# Patient Record
Sex: Female | Born: 1980
Health system: Southern US, Community
[De-identification: ages and names within clinical notes are randomized; demographics above are authoritative.]

## PROBLEM LIST (undated history)

## (undated) DIAGNOSIS — J45909 Unspecified asthma, uncomplicated: Secondary | ICD-10-CM

## (undated) DIAGNOSIS — D649 Anemia, unspecified: Secondary | ICD-10-CM

## (undated) HISTORY — DX: Anemia, unspecified: D64.9

## (undated) HISTORY — PX: WISDOM TOOTH EXTRACTION: SHX21

---

## 2001-12-17 ENCOUNTER — Emergency Department (HOSPITAL_COMMUNITY): Admission: EM | Admit: 2001-12-17 | Discharge: 2001-12-17 | Payer: Self-pay | Admitting: Emergency Medicine

## 2001-12-17 ENCOUNTER — Encounter: Payer: Self-pay | Admitting: Emergency Medicine

## 2006-05-08 ENCOUNTER — Emergency Department (HOSPITAL_COMMUNITY): Admission: EM | Admit: 2006-05-08 | Discharge: 2006-05-08 | Payer: Self-pay | Admitting: Emergency Medicine

## 2006-05-15 ENCOUNTER — Emergency Department (HOSPITAL_COMMUNITY): Admission: EM | Admit: 2006-05-15 | Discharge: 2006-05-15 | Payer: Self-pay | Admitting: Emergency Medicine

## 2008-06-07 ENCOUNTER — Emergency Department (HOSPITAL_COMMUNITY): Admission: EM | Admit: 2008-06-07 | Discharge: 2008-06-07 | Payer: Self-pay | Admitting: Emergency Medicine

## 2008-12-22 ENCOUNTER — Emergency Department: Payer: Self-pay | Admitting: Emergency Medicine

## 2012-06-18 ENCOUNTER — Emergency Department: Payer: Self-pay | Admitting: Emergency Medicine

## 2012-06-23 ENCOUNTER — Emergency Department: Payer: Self-pay | Admitting: Emergency Medicine

## 2013-04-15 ENCOUNTER — Emergency Department (HOSPITAL_COMMUNITY)
Admission: EM | Admit: 2013-04-15 | Discharge: 2013-04-15 | Disposition: A | Discharge status: Home / Self Care | Payer: 59 | Attending: Emergency Medicine | Admitting: Emergency Medicine

## 2013-04-15 ENCOUNTER — Emergency Department (HOSPITAL_COMMUNITY): Payer: 59

## 2013-04-15 ENCOUNTER — Encounter (HOSPITAL_COMMUNITY): Payer: Self-pay | Admitting: Emergency Medicine

## 2013-04-15 DIAGNOSIS — J45909 Unspecified asthma, uncomplicated: Secondary | ICD-10-CM | POA: Insufficient documentation

## 2013-04-15 DIAGNOSIS — IMO0001 Reserved for inherently not codable concepts without codable children: Secondary | ICD-10-CM | POA: Insufficient documentation

## 2013-04-15 DIAGNOSIS — M542 Cervicalgia: Secondary | ICD-10-CM | POA: Insufficient documentation

## 2013-04-15 DIAGNOSIS — Z79899 Other long term (current) drug therapy: Secondary | ICD-10-CM | POA: Insufficient documentation

## 2013-04-15 HISTORY — DX: Unspecified asthma, uncomplicated: J45.909

## 2013-04-15 MED ORDER — DIAZEPAM 5 MG PO TABS: 5.0000 mg | ORAL_TABLET | Freq: Two times a day (BID) | ORAL | Status: DC

## 2013-04-15 MED ORDER — MELOXICAM 7.5 MG PO TABS: 15.0000 mg | ORAL_TABLET | Freq: Every day | ORAL | Status: DC

## 2013-04-15 MED ORDER — DIAZEPAM 5 MG PO TABS
5.0000 mg | ORAL_TABLET | Freq: Once | ORAL | Status: AC
  Administered 2013-04-15: 5 mg via ORAL
  Filled 2013-04-15: qty 1

## 2013-04-15 MED ORDER — KETOROLAC TROMETHAMINE 30 MG/ML IJ SOLN
30.0000 mg | Freq: Once | INTRAMUSCULAR | Status: AC
  Administered 2013-04-15: 30 mg via INTRAMUSCULAR
  Filled 2013-04-15: qty 1

## 2013-04-15 NOTE — ED Provider Notes (Signed)
CSN: [id number]     Arrival date & time 04/15/13  1234 History   First MD Initiated Contact with Patient 04/15/13 1253     Chief Complaint  Patient presents with  . Neck Pain   (Consider location/radiation/quality/duration/timing/severity/associated sxs/prior Treatment) HPI Comments: Patient is a 32 year old female with a history of asthma who presents for neck pain x "a few months", worsening x 1 week. Patient states that pain was intermittent onset but has become more constant in nature over the past week. She describes the pain as a tightening sensation that radiates from between her shoulder blades to the base of her neck. Patient denies any aggravating or alleviating factors of her symptoms. Patient has tried multiple over-the-counter medications without any relief so she stopped taking them. Patient does endorse being under more stress lately. She denies a history of cancer or IV drug use as well as associated fever, chest pain, shortness of breath, nausea or vomiting, numbness/tingling, extremity weakness, and injury to the area.  Patient is a 32 y.o. female presenting with neck pain. The history is provided by the patient. No language interpreter was used.  Neck Pain   Past Medical History  Diagnosis Date  . Asthma    History reviewed. No pertinent past surgical history. No family history on file. History  Substance Use Topics  . Smoking status: Never Smoker   . Smokeless tobacco: Not on file  . Alcohol Use: Yes   OB History   Grav Para Term Preterm Abortions TAB SAB Ect Mult Living                 Review of Systems  Musculoskeletal: Positive for myalgias and neck pain. Negative for neck stiffness.  All other systems reviewed and are negative.    Allergies  Review of patient's allergies indicates no known allergies.  Home Medications   Current Outpatient Rx  Name  Route  Sig  Dispense  Refill  . albuterol (PROVENTIL HFA;VENTOLIN HFA) 108 (90 BASE) MCG/ACT  inhaler   Inhalation   Inhale 2 puffs into the lungs every 6 (six) hours as needed for shortness of breath.         Marland Kitchen aspirin 325 MG tablet   Oral   Take 325 mg by mouth once as needed for pain.         Marland Kitchen HYDROcodone-acetaminophen (NORCO/VICODIN) 5-325 MG per tablet   Oral   Take 1 tablet by mouth every 6 (six) hours as needed for pain.         . Multiple Vitamin (MULTIVITAMIN WITH MINERALS) TABS tablet   Oral   Take 1 tablet by mouth daily.          BP 133/91  Pulse 84  Temp(Src) 98.3 F (36.8 C) (Oral)  Resp 16  Ht 5\' 9"  (1.753 m)  Wt 331 lb 1 oz (150.169 kg)  BMI 48.87 kg/m2  SpO2 100%  LMP 04/01/2013  Physical Exam  Nursing note and vitals reviewed. Constitutional: She is oriented to person, place, and time. She appears well-developed and well-nourished. No distress.  HENT:  Head: Normocephalic and atraumatic.  Eyes: Conjunctivae and EOM are normal. No scleral icterus.  Neck: Normal range of motion.  Cardiovascular: Normal rate, regular rhythm, normal heart sounds and intact distal pulses.   Pulses:      Radial pulses are 2+ on the right side, and 2+ on the left side.  Pulmonary/Chest: Effort normal and breath sounds normal. No respiratory distress. She has no wheezes.  She has no rales.  Musculoskeletal: Normal range of motion. She exhibits no edema.  TTP of b/l cervical and upper thoracic paraspinal muscles with spasm. No significant midline TTP. No bony deformities or step offs palpated.  Neurological: She is alert and oriented to person, place, and time.  GCS 15. Patient has equal grip strength bilaterally with 5/5 strength against resistance in her bilateral upper extremities. Sensation to light touch intact on bilateral upper extremities. Patient also able to differentiate between sharp and dull touch throughout dermatomes C3-T2.  Skin: Skin is warm and dry. No rash noted. She is not diaphoretic. No erythema. No pallor.  Psychiatric: She has a normal mood  and affect. Her behavior is normal.    ED Course  Procedures (including critical care time) Labs Review Labs Reviewed - No data to display Imaging Review Dg Cervical Spine With Flex & Extend  04/15/2013   CLINICAL DATA:  Chronic neck pain. No injury.  EXAM: CERVICAL SPINE COMPLETE WITH FLEXION AND EXTENSION VIEWS  COMPARISON:  None.  FINDINGS: Mild straightening in the cervical spine. Normal alignment at the cervicothoracic junction. Normal appearance of the prevertebral soft tissues. No pathologic motion on the flexion and extension views. Degenerative changes along the anterior aspect of C6-C7. The oblique images raise concern for irregularity involving the right C6 pedicle and facets. This could represent an old fracture. No significant bony encroachment of the neural foramen.  IMPRESSION: There is cortical irregularity and lucency in the right C6 pedicle and facet region. This finding could be related to an old fracture or developmental variant. This could be better evaluated with a CT of the cervical spine.  Degenerative changes at C6-C7.   Electronically Signed   By: Richarda Overlie M.D.   On: 04/15/2013 15:14    EKG Interpretation   None       MDM   1. Neck pain     32 year old female presents for neck pain which has been ongoing for the last few months and worsening x 1 week. Patient denies a history of neck trauma or injury as well as cancer or IV drug use. She is well and nontoxic appearing today as well as hemodynamically stable and afebrile. Patient with normal range of motion of her bilateral upper extremities with normal grip strength, strength against resistance, and sensation. X-ray of the cervical spine ordered for further evaluation of patient's symptoms. Findings with cortical irregularity and lucency of the right C6 pedicle. Radiology recommending further evaluation with CT scan. CT ordered for further evaluation of the x-ray findings. Patient treated in the ED with Toradol and  Valium. Patient endorses moderate to significant improvement in her symptoms with this treatment regimen.   Patient signed out to Irish Elders, FNP at shift change with imaging pending. Anticipate d/c home with orthopedic follow up, Mobic, and Valium for symptoms if CT imaging unremarkable.    Antony Madura, PA-C 04/15/13 1539

## 2013-04-15 NOTE — ED Provider Notes (Signed)
Medical screening examination/treatment/procedure(s) were performed by non-physician practitioner and as supervising physician I was immediately available for consultation/collaboration.  EKG Interpretation   None         Gorgeous Newlun N Fabiana Dromgoole, DO 04/15/13 1623 

## 2013-04-15 NOTE — ED Provider Notes (Signed)
Patient seen/examined in the Emergency Department in conjunction with Midlevel Provider Walker Patient reports neck pain Exam : awake/alert, no distress, watching TV, no focal weakness noted Plan: awaiting CT imaging    Joya Gaskins, MD 04/15/13 [phone]

## 2013-04-15 NOTE — ED Notes (Signed)
Pt back to room from CT

## 2013-04-15 NOTE — ED Notes (Addendum)
Back of neck pain and between shoulder blades hurting x a few months since sat had hurt non stop has been taking otc meds not helping demies injury or sleeping wrong back of head tingling dizziness started the past few days

## 2013-04-15 NOTE — ED Provider Notes (Signed)
  Physical Exam  BP 133/106  Pulse 68  Temp(Src) 98.3 F (36.8 C) (Oral)  Resp 16  Ht 5\' 9"  (1.753 m)  Wt 331 lb 1 oz (150.169 kg)  BMI 48.87 kg/m2  SpO2 100%  LMP 04/01/2013  Physical Exam  ED Course  Procedures  MDM Neck pain   Feeling better after meds, C6-C7 degenerative changes vs old fx. Evaluate with C-spine CT. Awaiting results. Anticipate discharge and follow-up with Ortho. C-spine; degenerative changes in lower Cspine. May need MRI, Pt agrees to follow-up with Ortho.      Irish Elders, NP 04/15/13 1649

## 2013-04-15 NOTE — ED Notes (Addendum)
Onset several months, upper back pain between shoulder blades.  Pain became constant 6 days ago and pain is moving up into back of head.  No known injuries or exercise.  Went to chiropractor x 1 with temporary relief.  Last took hydrocodone last night no relief in pain, did get some sleep.  Gets best relief laying flat.  Onset 1 week tingling noted in right arm at times.   Arm grips equal.  No numbness or tingling in LE.

## 2013-04-16 NOTE — ED Provider Notes (Signed)
Medical screening examination/treatment/procedure(s) were conducted as a shared visit with non-physician practitioner(s) and myself.  I personally evaluated the patient during the encounter.  EKG Interpretation   None         Joya Gaskins, MD 04/16/13 1045

## 2020-04-17 DIAGNOSIS — E611 Iron deficiency: Secondary | ICD-10-CM | POA: Insufficient documentation

## 2021-02-28 ENCOUNTER — Encounter: Payer: Self-pay | Admitting: Obstetrics & Gynecology

## 2021-02-28 ENCOUNTER — Telehealth: Payer: Self-pay | Admitting: Obstetrics & Gynecology

## 2021-03-21 ENCOUNTER — Encounter: Payer: Self-pay | Admitting: Obstetrics & Gynecology

## 2021-04-18 ENCOUNTER — Other Ambulatory Visit: Payer: Self-pay

## 2021-04-18 ENCOUNTER — Encounter: Payer: Self-pay | Admitting: Obstetrics & Gynecology

## 2021-04-18 ENCOUNTER — Other Ambulatory Visit (HOSPITAL_COMMUNITY)
Admission: RE | Admit: 2021-04-18 | Discharge: 2021-04-18 | Disposition: A | Discharge status: Home / Self Care | Payer: 59 | Source: Ambulatory Visit | Attending: Obstetrics & Gynecology | Admitting: Obstetrics & Gynecology

## 2021-04-18 ENCOUNTER — Ambulatory Visit (INDEPENDENT_AMBULATORY_CARE_PROVIDER_SITE_OTHER): Payer: 59 | Admitting: Obstetrics & Gynecology

## 2021-04-18 ENCOUNTER — Encounter: Payer: 59 | Admitting: Obstetrics & Gynecology

## 2021-04-18 VITALS — BP 170/107 | HR 89 | Ht 70.0 in | Wt 355.2 lb

## 2021-04-18 DIAGNOSIS — Z6841 Body Mass Index (BMI) 40.0 and over, adult: Secondary | ICD-10-CM | POA: Diagnosis not present

## 2021-04-18 DIAGNOSIS — Z124 Encounter for screening for malignant neoplasm of cervix: Secondary | ICD-10-CM | POA: Diagnosis not present

## 2021-04-18 DIAGNOSIS — E669 Obesity, unspecified: Secondary | ICD-10-CM | POA: Insufficient documentation

## 2021-04-18 DIAGNOSIS — N923 Ovulation bleeding: Secondary | ICD-10-CM

## 2021-04-18 MED ORDER — MEDROXYPROGESTERONE ACETATE 10 MG PO TABS: ORAL_TABLET | ORAL | 2 refills | Status: AC

## 2021-04-18 NOTE — Progress Notes (Signed)
  Subjective:     Patient ID: Erin Zuniga, female   DOB: [birth year], 40 y.o.   MRN: [id number]  Erin Zuniga is a 40 year old woman who presents with a three month history of daily intermenstrual spotting lasting the entire month between menstrual cycles. She describes the spotting as light, pink, with a mild fishy odor. Her LMP was one week ago and she normally has bleeding for a week with her regular cycles. When her symptoms first began, she thought the bleeding may be related to an episode of intercourse; however, she feels that should not cause this level of prolonged daily spotting. She does not endorse any previous history of vagina spotting for this duration. She has had BV in the past and states the current odor seems similar to her previous BV episodes. She has a history of genital herpes for which she takes L-Lysine 1000 mg daily.  Vaginal Bleeding The patient's primary symptoms include vaginal discharge. The patient's pertinent negatives include no pelvic pain. Pertinent negatives include no chills, fever, nausea or vomiting.    Review of Systems  Constitutional:  Negative for chills and fever.  Respiratory:  Negative for cough and shortness of breath.   Gastrointestinal:  Negative for nausea and vomiting.  Genitourinary:  Positive for vaginal bleeding and vaginal discharge. Negative for pelvic pain and vaginal pain.      Objective:   Physical Exam Constitutional:      General: She is not in acute distress. Genitourinary:    Labia:        Right: No rash, tenderness, lesion or injury.        Left: No rash, tenderness, lesion or injury.      Vagina: Vaginal discharge (white, non malodorous) present. No erythema, bleeding or lesions.     Cervix: No friability, erythema or cervical bleeding.  Skin:    General: Skin is warm and dry.  Neurological:     Mental Status: She is alert.  Psychiatric:        Behavior: Behavior is cooperative.       Assessment:     Erin Zuniga is a 40 year old woman with a three month history of intermenstrual vaginal spotting and vaginal discharge concerning for possible vaginitis vs. irregular menstrual bleeding.   Vaginal Spotting Daily intermenstrual vaginal spotting for three months duration. No current hormonal contraceptive use. No bleeding noted on exam today. Would like to see this vaginal spotting resolve with progesterone use during first two weeks of each month. Suspect this will also provide her with lighter menstrual periods.  Vaginal Discharge Pt with vaginal discharge following episode of intercourse which is similar to her prior episodes of BV. White vaginal discharge present on pelvic exam today suggesting possible vaginitis.    Plan:     Vaginal Spotting -Provera 10 mg daily for day 1-14 of each month -Follow up after at least 2 months for symptom re-check  Vaginal Discharge -Wet mount and pap testing performed today in office -Will follow up on results and initiate appropriate treatment if indicated by results -Plan to inform patients of results  Attestation of Attending Supervision of Medical Student: Evaluation and management procedures were performed by the medical student under my supervision and collaboration.  I have reviewed the student's note and chart, and I agree with the management and plan.  Scheryl Darter, MD, FACOG Attending Obstetrician & Gynecologist Faculty Practice, [facility]

## 2021-04-20 LAB — CYTOLOGY - PAP
Chlamydia: NEGATIVE
Comment: NEGATIVE
Comment: NEGATIVE
Comment: NEGATIVE
Comment: NORMAL
Diagnosis: NEGATIVE
High risk HPV: NEGATIVE
Neisseria Gonorrhea: NEGATIVE
Trichomonas: NEGATIVE

## 2021-05-10 ENCOUNTER — Other Ambulatory Visit: Payer: Self-pay | Admitting: Nurse Practitioner

## 2021-05-10 DIAGNOSIS — N939 Abnormal uterine and vaginal bleeding, unspecified: Secondary | ICD-10-CM

## 2021-05-10 DIAGNOSIS — N923 Ovulation bleeding: Secondary | ICD-10-CM

## 2021-05-25 ENCOUNTER — Ambulatory Visit
Admission: RE | Admit: 2021-05-25 | Discharge: 2021-05-25 | Disposition: A | Discharge status: Home / Self Care | Payer: 59 | Source: Ambulatory Visit | Attending: Nurse Practitioner | Admitting: Nurse Practitioner

## 2021-05-25 DIAGNOSIS — N923 Ovulation bleeding: Secondary | ICD-10-CM

## 2021-05-25 DIAGNOSIS — N939 Abnormal uterine and vaginal bleeding, unspecified: Secondary | ICD-10-CM

## 2021-05-30 ENCOUNTER — Other Ambulatory Visit: Payer: Self-pay | Admitting: Nurse Practitioner

## 2021-05-30 DIAGNOSIS — N9489 Other specified conditions associated with female genital organs and menstrual cycle: Secondary | ICD-10-CM

## 2022-06-16 IMAGING — US US PELVIS COMPLETE WITH TRANSVAGINAL
1 series · 13 of 25 positions shown · non-contrast
Comparison: None

CLINICAL DATA: Abnormal uterine bleeding, intermenstrual bleeding,
continuous bleeding, LMP 04/09/2021

EXAM:
TRANSABDOMINAL AND TRANSVAGINAL ULTRASOUND OF PELVIS
TECHNIQUE: Both transabdominal and transvaginal ultrasound examinations of the
pelvis were performed. Transabdominal technique was performed for
global imaging of the pelvis including uterus, ovaries, adnexal
regions, and pelvic cul-de-sac. It was necessary to proceed with
endovaginal exam following the transabdominal exam to visualize the
uterus, endometrium, and ovaries. Transabdominal imaging limited by
inadequate bladder distention and poor acoustic window.

[Series 1: us pelvis complete with transvaginal · 0.28mm/px · 56 acquisitions, 13 frames shown]
[im 1/56]
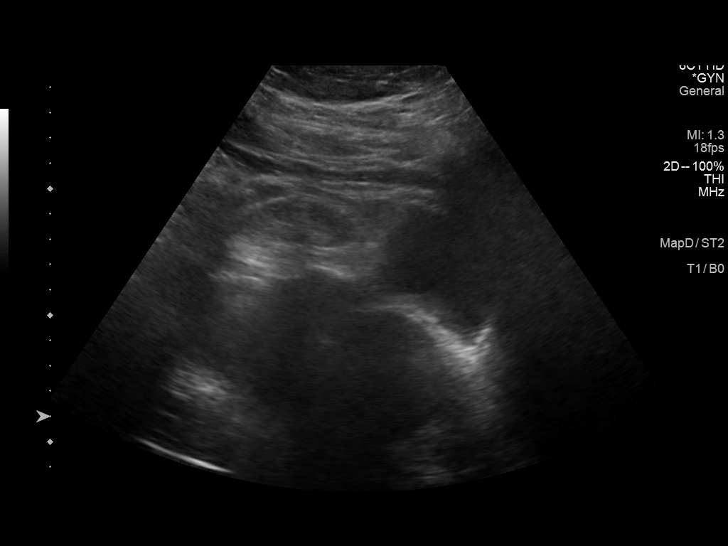
[im 5/56]
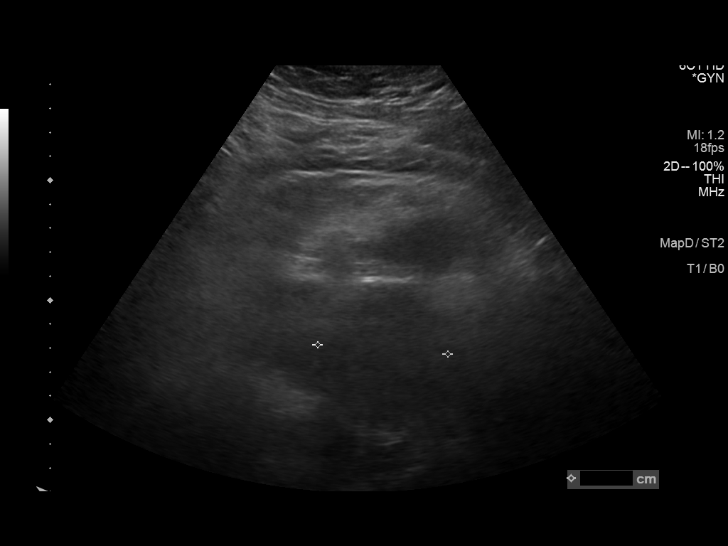
[im 10/56]
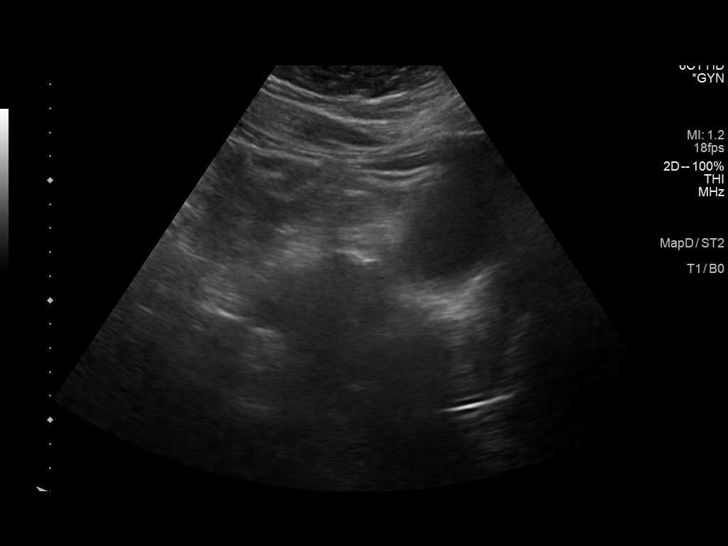
[im 14/56]
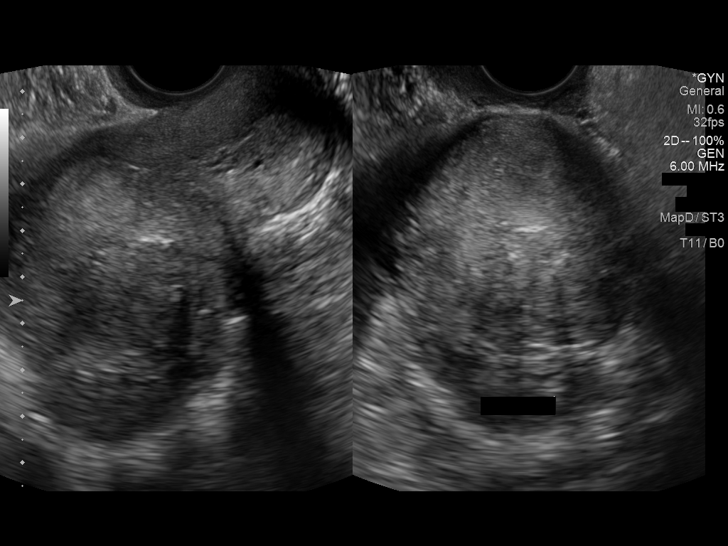
[im 19/56]
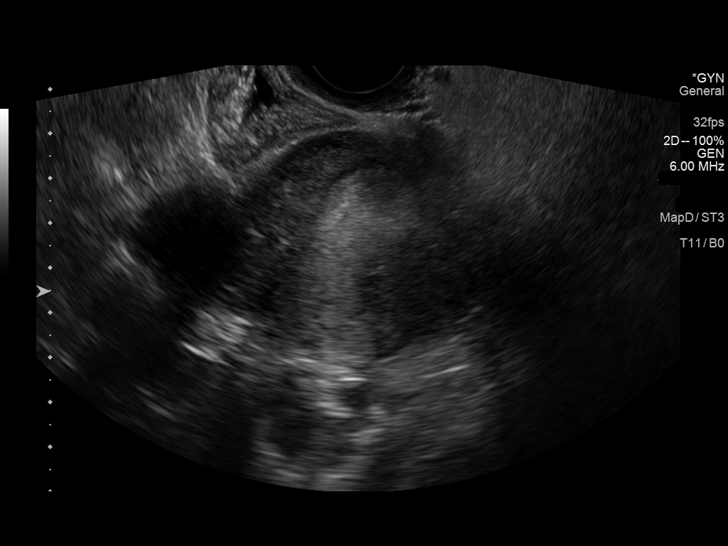
[im 23/56]
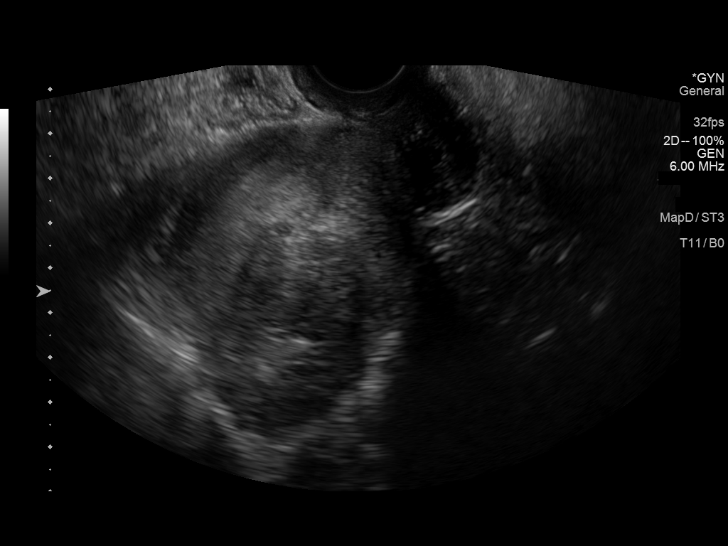
[im 28/56]
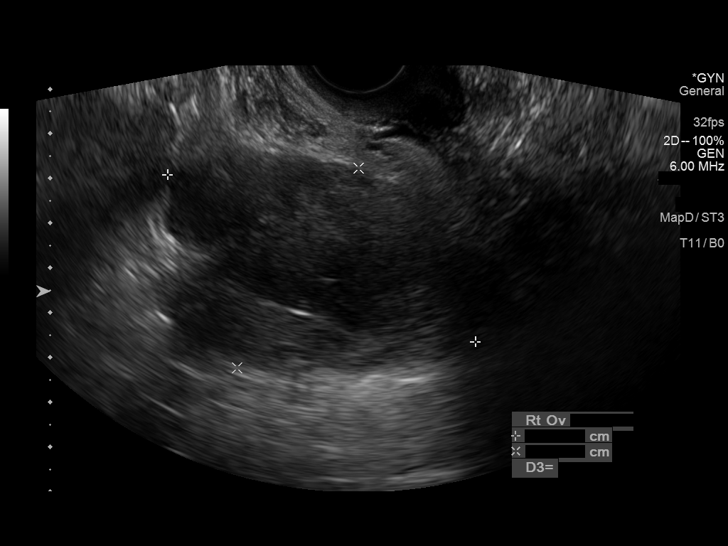
[im 33/56]
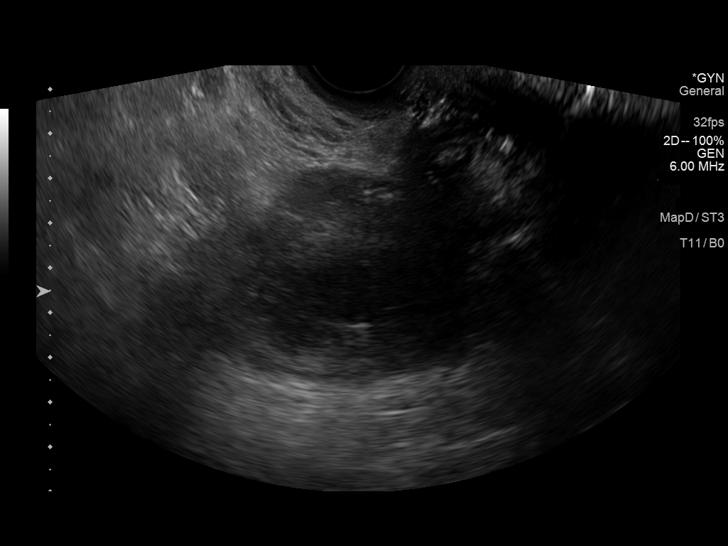
[im 37/56]
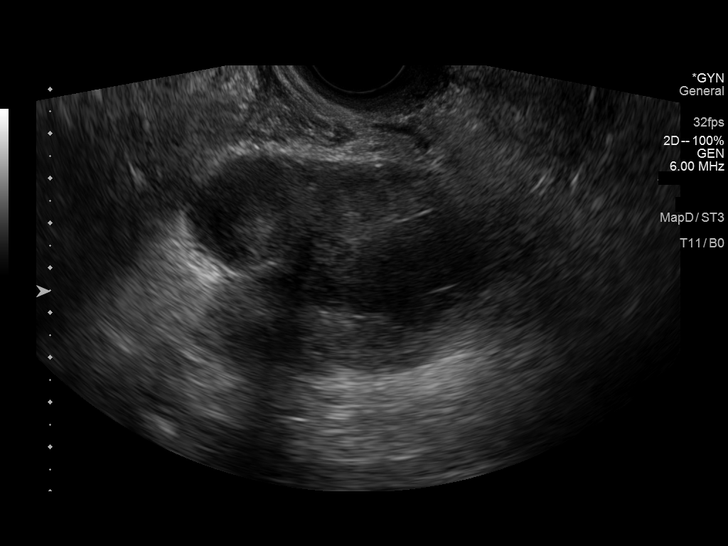
[im 42/56]
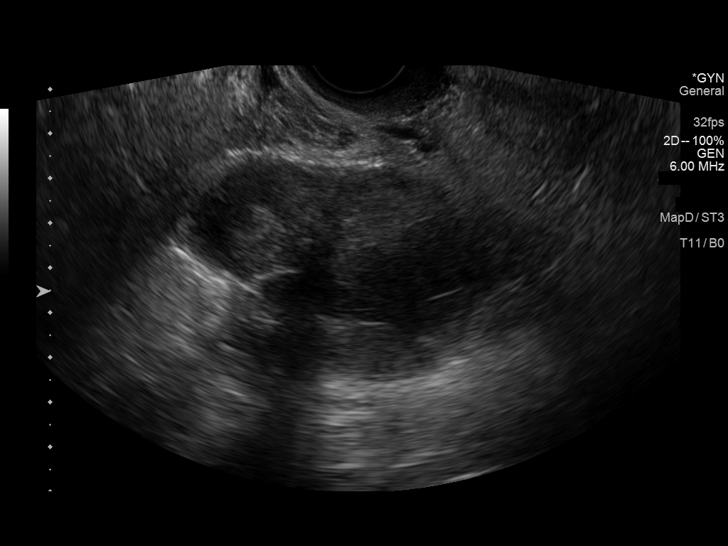
[im 46/56]
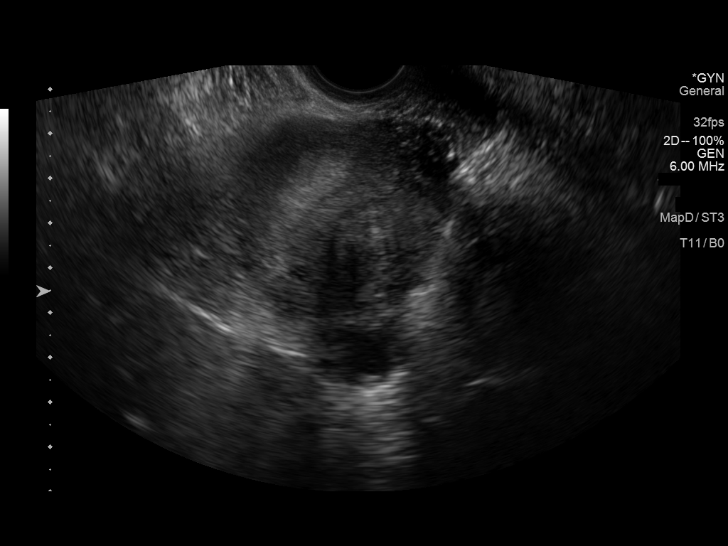
[im 51/56]
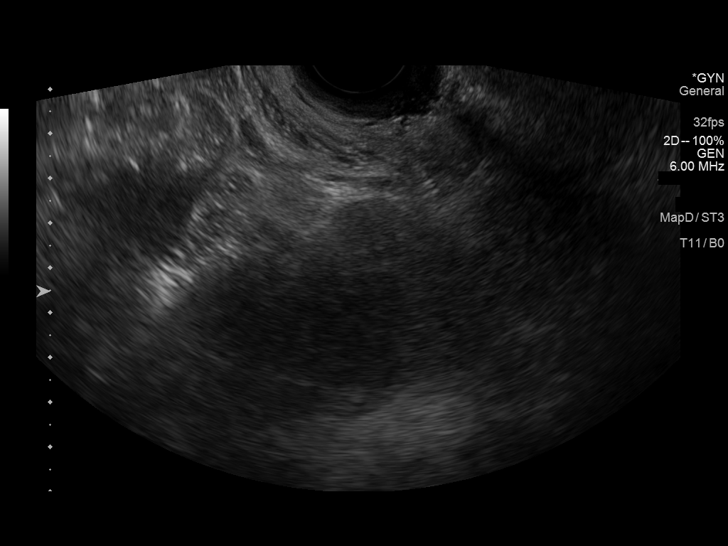
[im 56/56]
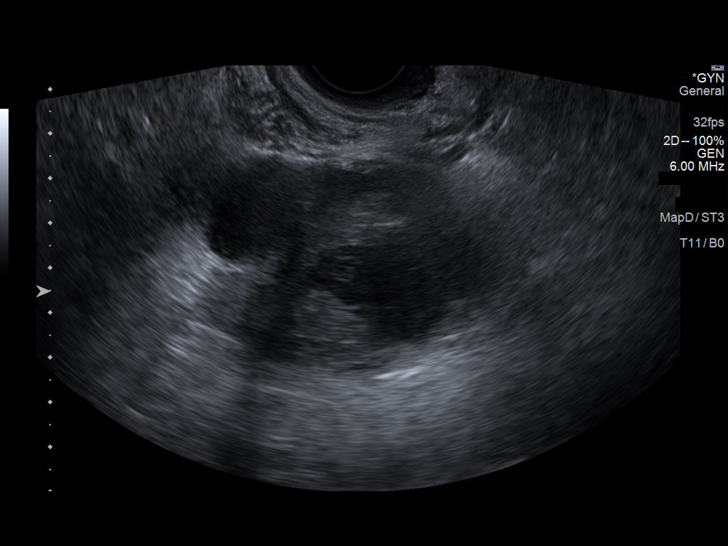

[13 of 25 positions shown; findings below may reference images not displayed]

FINDINGS: Uterus

Measurements: 8.8 x 4.3 x 5.1 cm = volume: 102 mL. Anteverted. Large
mass at upper uterus consistent with a large uterine leiomyoma
measuring 5.1 x 5.1 x 5.0 cm, likely posterior.

Endometrium

Obscured by presence of a large leiomyoma at the upper uterus

Right ovary

No normal appearing RIGHT ovary visualized, see below

Left ovary

Measurements: 2.7 x 1.5 x 2.4 cm = volume: 4.8 mL. Normal morphology
without mass

Other findings

No free pelvic fluid. RIGHT adnexal mass 7.8 x 5.2 x 4.4 cm, 95 mL
volume, may represent enlarged abnormal RIGHT ovary or less likely
portions of this may represent exophytic leiomyoma arising from the
uterus.
IMPRESSION: Large upper uterine leiomyoma 5.1 cm diameter.

Obscured endometrial complex.

7.8 x 5.2 x 4.4 cm indeterminate RIGHT adnexal mass question ovarian
mass or potentially exophytic leiomyoma arising from uterus; further
characterization by MR imaging with and without contrast
recommended.

## 2022-10-09 ENCOUNTER — Encounter (HOSPITAL_COMMUNITY): Payer: Self-pay

## 2022-10-09 ENCOUNTER — Non-Acute Institutional Stay (HOSPITAL_COMMUNITY)
Admission: RE | Admit: 2022-10-09 | Discharge: 2022-10-09 | Disposition: A | Discharge status: Home / Self Care | Payer: 59 | Source: Ambulatory Visit | Attending: Obstetrics and Gynecology | Admitting: Obstetrics and Gynecology

## 2022-10-09 ENCOUNTER — Ambulatory Visit (INDEPENDENT_AMBULATORY_CARE_PROVIDER_SITE_OTHER): Payer: 59 | Admitting: Nurse Practitioner

## 2022-10-09 ENCOUNTER — Encounter: Payer: Self-pay | Admitting: Nurse Practitioner

## 2022-10-09 ENCOUNTER — Telehealth (HOSPITAL_COMMUNITY): Payer: Self-pay

## 2022-10-09 VITALS — BP 142/75 | Temp 97.7°F | Ht 70.0 in | Wt 354.2 lb

## 2022-10-09 DIAGNOSIS — Z539 Procedure and treatment not carried out, unspecified reason: Secondary | ICD-10-CM | POA: Insufficient documentation

## 2022-10-09 DIAGNOSIS — R030 Elevated blood-pressure reading, without diagnosis of hypertension: Secondary | ICD-10-CM

## 2022-10-09 DIAGNOSIS — D649 Anemia, unspecified: Secondary | ICD-10-CM | POA: Diagnosis not present

## 2022-10-09 MED ORDER — SODIUM CHLORIDE 0.9 % IV SOLN
510.0000 mg | Freq: Once | INTRAVENOUS | Status: DC
  Filled 2022-10-09: qty 17

## 2022-10-09 MED ORDER — SODIUM CHLORIDE 0.9 % IV SOLN: INTRAVENOUS | Status: DC | PRN

## 2022-10-09 NOTE — Progress Notes (Signed)
  ID: Erin Zuniga, female    DOB: [birth year], 42 y.o.   MRN: [id number]  Chief Complaint  Patient presents with   Establish Care   Hypertension    Referring provider: No ref. provider found  HPI  42 year old female with history of obesity, low iron, irregular menstrual cycles.  Patient is followed by [facility] and was sent to our office for an iron infusion in the day hospital today.  Her blood pressure was noted to be elevated on arrival since she was placed on her schedule for checkup for hypertension.  After sitting in the waiting room for a little while pressure did return to normal.  Patient does not currently have a PCP.  We discussed that we will follow-up in 1 month to recheck blood pressure and do a complete physical. Denies f/c/s, n/v/d, hemoptysis, PND, leg swelling Denies chest pain or edema        No Known Allergies   There is no immunization history on file for this patient.  Past Medical History:  Diagnosis Date   Anemia    Asthma     Tobacco History: Social History   Tobacco Use  Smoking Status Never  Smokeless Tobacco Not on file   Counseling given: Not Answered   Outpatient Encounter Medications as of 10/09/2022  Medication Sig   albuterol (PROVENTIL HFA;VENTOLIN HFA) 108 (90 BASE) MCG/ACT inhaler Inhale 2 puffs into the lungs every 6 (six) hours as needed for shortness of breath.   aspirin 325 MG tablet Take 325 mg by mouth once as needed for pain.   ferrous sulfate 325 (65 FE) MG tablet Take 325 mg by mouth daily with breakfast.   medroxyPROGESTERone (PROVERA) 10 MG tablet Daily for day 1-14 each month   Multiple Vitamin (MULTIVITAMIN WITH MINERALS) TABS tablet Take 1 tablet by mouth daily.   OVER THE COUNTER MEDICATION L-lysine PRN   Facility-Administered Encounter Medications as of 10/09/2022  Medication   0.9 %  sodium chloride infusion   ferumoxytol (FERAHEME) 510 mg in sodium chloride 0.9 % 100 mL IVPB     Review  of Systems  Review of Systems  Constitutional: Negative.   HENT: Negative.    Cardiovascular: Negative.   Gastrointestinal: Negative.   Allergic/Immunologic: Negative.   Neurological: Negative.   Psychiatric/Behavioral: Negative.         Physical Exam  BP (!) 142/75   Temp 97.7 F (36.5 C)   Ht  (1.778 m)   Wt (!) 354 lb 3.2 oz (160.7 kg)   LMP 09/30/2022   BMI 50.82 kg/m   Wt Readings from Last 5 Encounters:  10/09/22 (!) 354 lb 3.2 oz (160.7 kg)  04/18/21 (!) 355 lb 3.2 oz (161.1 kg)  04/15/13 (!) 331 lb 1 oz (150.2 kg)     Physical Exam Vitals and nursing note reviewed.  Constitutional:      General: She is not in acute distress.    Appearance: She is well-developed.  Cardiovascular:     Rate and Rhythm: Normal rate and regular rhythm.  Pulmonary:     Effort: Pulmonary effort is normal.     Breath sounds: Normal breath sounds.  Neurological:     Mental Status: She is alert and oriented to person, place, and time.      Lab Results:  CBC No results found for: "WBC", "RBC", "HGB", "HCT", "PLT", "MCV", "MCH", "MCHC", "RDW", "LYMPHSABS", "MONOABS", "EOSABS", "BASOSABS"  BMET No results found for: "NA", "K", "CL", "CO2", "GLUCOSE", "  BUN", "CREATININE", "CALCIUM", "GFRNONAA", "GFRAA"   Assessment & Plan:   Elevated blood pressure reading Blood pressure stable in the office today  Low salt diet  Start exercise routine   Follow up:  Follow up in 3 months     Ivonne Andrew, NP 10/09/2022

## 2022-10-09 NOTE — Progress Notes (Signed)
Pt came into clinic today for fist dose of IV Feraheme 510 mg. Pt BP was 163/109. BP on recheck was 159/113. Pt reports having no symptoms, and states that she is nervous. Pt states that she does not take any medication for BP. RN called [facility], and spoke with Rogers. Per Dr. Mindi Slicker, IV Feraheme should not be administered today due to elevated BP, and that pt should see PCP to address BP and reschedule to receive iron infusion. Per pt she currently does not have PCP. [facility], and will see Angus Seller, FNP at 11:20 AM today to establish care. Pt advised to reschedule iron infusion after BP issues have been addressed by PCP, patient verbalized understanding. Pt is alert, oriented, and ambulatory at discharge.

## 2022-10-09 NOTE — Patient Instructions (Addendum)
1. Elevated blood pressure reading   Blood pressure stable in the office today  Low salt diet  Start exercise routine   Follow up:  Follow up in 3 months    DASH Eating Plan DASH stands for Dietary Approaches to Stop Hypertension. The DASH eating plan is a healthy eating plan that has been shown to: Reduce high blood pressure (hypertension). Reduce your risk for type 2 diabetes, heart disease, and stroke. Help with weight loss. What are tips for following this plan? Reading food labels Check food labels for the amount of salt (sodium) per serving. Choose foods with less than 5 percent of the Daily Value of sodium. Generally, foods with less than 300 milligrams (mg) of sodium per serving fit into this eating plan. To find whole grains, look for the word "whole" as the first word in the ingredient list. Shopping Buy products labeled as "low-sodium" or "no salt added." Buy fresh foods. Avoid canned foods and pre-made or frozen meals. Cooking Avoid adding salt when cooking. Use salt-free seasonings or herbs instead of table salt or sea salt. Check with your health care provider or pharmacist before using salt substitutes. Do not fry foods. Cook foods using healthy methods such as baking, boiling, grilling, roasting, and broiling instead. Cook with heart-healthy oils, such as olive, canola, avocado, soybean, or sunflower oil. Meal planning  Eat a balanced diet that includes: 4 or more servings of fruits and 4 or more servings of vegetables each day. Try to fill one-half of your plate with fruits and vegetables. 6-8 servings of whole grains each day. Less than 6 oz (170 g) of lean meat, poultry, or fish each day. A 3-oz (85-g) serving of meat is about the same size as a deck of cards. One egg equals 1 oz (28 g). 2-3 servings of low-fat dairy each day. One serving is 1 cup (237 mL). 1 serving of nuts, seeds, or beans 5 times each week. 2-3 servings of heart-healthy fats. Healthy fats  called omega-3 fatty acids are found in foods such as walnuts, flaxseeds, fortified milks, and eggs. These fats are also found in cold-water fish, such as sardines, salmon, and mackerel. Limit how much you eat of: Canned or prepackaged foods. Food that is high in trans fat, such as some fried foods. Food that is high in saturated fat, such as fatty meat. Desserts and other sweets, sugary drinks, and other foods with added sugar. Full-fat dairy products. Do not salt foods before eating. Do not eat more than 4 egg yolks a week. Try to eat at least 2 vegetarian meals a week. Eat more home-cooked food and less restaurant, buffet, and fast food. Lifestyle When eating at a restaurant, ask that your food be prepared with less salt or no salt, if possible. If you drink alcohol: Limit how much you use to: 0-1 drink a day for women who are not pregnant. 0-2 drinks a day for men. Be aware of how much alcohol is in your drink. In the [place] one drink equals one 12 oz bottle of beer (355 mL), one 5 oz glass of wine (148 mL), or one 1 oz glass of hard liquor (44 mL). General information Avoid eating more than 2,300 mg of salt a day. If you have hypertension, you may need to reduce your sodium intake to 1,500 mg a day. Work with your health care provider to maintain a healthy body weight or to lose weight. Ask what an ideal weight is for you. Get at  least 30 minutes of exercise that causes your heart to beat faster (aerobic exercise) most days of the week. Activities may include walking, swimming, or biking. Work with your health care provider or dietitian to adjust your eating plan to your individual calorie needs. What foods should I eat? Fruits All fresh, dried, or frozen fruit. Canned fruit in natural juice (without added sugar). Vegetables Fresh or frozen vegetables (raw, steamed, roasted, or grilled). Low-sodium or reduced-sodium tomato and vegetable juice. Low-sodium or reduced-sodium tomato sauce  and tomato paste. Low-sodium or reduced-sodium canned vegetables. Grains Whole-grain or whole-wheat bread. Whole-grain or whole-wheat pasta. Brown rice. Orpah Cobb. Bulgur. Whole-grain and low-sodium cereals. Pita bread. Low-fat, low-sodium crackers. Whole-wheat flour tortillas. Meats and other proteins Skinless chicken or [place]. Ground chicken or [place]. Pork with fat trimmed off. Fish and seafood. Egg whites. Dried beans, peas, or lentils. Unsalted nuts, nut butters, and seeds. Unsalted canned beans. Lean cuts of beef with fat trimmed off. Low-sodium, lean precooked or cured meat, such as sausages or meat loaves. Dairy Low-fat (1%) or fat-free (skim) milk. Reduced-fat, low-fat, or fat-free cheeses. Nonfat, low-sodium ricotta or cottage cheese. Low-fat or nonfat yogurt. Low-fat, low-sodium cheese. Fats and oils Soft margarine without trans fats. Vegetable oil. Reduced-fat, low-fat, or light mayonnaise and salad dressings (reduced-sodium). Canola, safflower, olive, avocado, soybean, and sunflower oils. Avocado. Seasonings and condiments Herbs. Spices. Seasoning mixes without salt. Other foods Unsalted popcorn and pretzels. Fat-free sweets. The items listed above may not be a complete list of foods and beverages you can eat. Contact a dietitian for more information. What foods should I avoid? Fruits Canned fruit in a light or heavy syrup. Fried fruit. Fruit in cream or butter sauce. Vegetables Creamed or fried vegetables. Vegetables in a cheese sauce. Regular canned vegetables (not low-sodium or reduced-sodium). Regular canned tomato sauce and paste (not low-sodium or reduced-sodium). Regular tomato and vegetable juice (not low-sodium or reduced-sodium). Rosita Fire. Olives. Grains Baked goods made with fat, such as croissants, muffins, or some breads. Dry pasta or rice meal packs. Meats and other proteins Fatty cuts of meat. Ribs. Fried meat. Tomasa Blase. Bologna, salami, and other precooked or  cured meats, such as sausages or meat loaves. Fat from the back of a pig (fatback). Bratwurst. Salted nuts and seeds. Canned beans with added salt. Canned or smoked fish. Whole eggs or egg yolks. Chicken or [place] with skin. Dairy Whole or 2% milk, cream, and half-and-half. Whole or full-fat cream cheese. Whole-fat or sweetened yogurt. Full-fat cheese. Nondairy creamers. Whipped toppings. Processed cheese and cheese spreads. Fats and oils Butter. Stick margarine. Lard. Shortening. Ghee. Bacon fat. Tropical oils, such as coconut, palm kernel, or palm oil. Seasonings and condiments Onion salt, garlic salt, seasoned salt, table salt, and sea salt. Worcestershire sauce. Tartar sauce. Barbecue sauce. Teriyaki sauce. Soy sauce, including reduced-sodium. Steak sauce. Canned and packaged gravies. Fish sauce. Oyster sauce. Cocktail sauce. Store-bought horseradish. Ketchup. Mustard. Meat flavorings and tenderizers. Bouillon cubes. Hot sauces. Pre-made or packaged marinades. Pre-made or packaged taco seasonings. Relishes. Regular salad dressings. Other foods Salted popcorn and pretzels. The items listed above may not be a complete list of foods and beverages you should avoid. Contact a dietitian for more information. Where to find more information National Heart, Lung, and Blood Institute: PopSteam.is American Heart Association: www.heart.org Academy of Nutrition and Dietetics: www.eatright.org National Kidney Foundation: www.kidney.org Summary The DASH eating plan is a healthy eating plan that has been shown to reduce high blood pressure (hypertension). It may also reduce your  risk for type 2 diabetes, heart disease, and stroke. When on the DASH eating plan, aim to eat more fresh fruits and vegetables, whole grains, lean proteins, low-fat dairy, and heart-healthy fats. With the DASH eating plan, you should limit salt (sodium) intake to 2,300 mg a day. If you have hypertension, you may need to reduce  your sodium intake to 1,500 mg a day. Work with your health care provider or dietitian to adjust your eating plan to your individual calorie needs. This information is not intended to replace advice given to you by your health care provider. Make sure you discuss any questions you have with your health care provider. Document Revised: 05/14/2019 Document Reviewed: 05/14/2019 Elsevier Patient Education  2023 [employer].

## 2022-10-09 NOTE — Telephone Encounter (Signed)
Left VM for pt to return call to [facility] to schedule infusion appointment for next week , provided phone number and office hours in VM.

## 2022-10-09 NOTE — Assessment & Plan Note (Signed)
Blood pressure stable in the office today  Low salt diet  Start exercise routine   Follow up:  Follow up in 3 months

## 2022-10-14 ENCOUNTER — Non-Acute Institutional Stay (HOSPITAL_COMMUNITY)
Admission: RE | Admit: 2022-10-14 | Discharge: 2022-10-14 | Disposition: A | Discharge status: Home / Self Care | Payer: 59 | Source: Ambulatory Visit | Attending: Internal Medicine | Admitting: Internal Medicine

## 2022-10-14 DIAGNOSIS — D649 Anemia, unspecified: Secondary | ICD-10-CM | POA: Insufficient documentation

## 2022-10-14 MED ORDER — SODIUM CHLORIDE 0.9 % IV SOLN: INTRAVENOUS | Status: DC | PRN

## 2022-10-14 MED ORDER — SODIUM CHLORIDE 0.9 % IV SOLN
510.0000 mg | Freq: Once | INTRAVENOUS | Status: AC
  Administered 2022-10-14: 510 mg via INTRAVENOUS
  Filled 2022-10-14: qty 510

## 2022-10-14 NOTE — Progress Notes (Signed)
[facility] NOTE  Diagnosis: ICD-10: D64.9: Anemia, unspecified    Provider: Ellison Hughs, MD  Procedure: IV Feraheme 510 mg (#1 of 2)  Note: Patient received Feraheme 510 mg    infusion (dose #1 of 2) via PIV. Pt BP on arrival was 154/111, when rechecked it was 160/105. Pt reported having no symptoms and says she feels fine. Pts PCP provider, Angus Seller, NP notified and she said to have pt sit for a little bit, and then recheck. Pts BP after sitting for over 30 minutes is 155/96. Angus Seller, NP notified and she said it is ok to start iron infusion, but to monitor BP during infusion. Pt tolerated infusion with no adverse effects. Pts BP checked during infusion, and stayed within pts range. Pt observed for 30 minutes post infusion. Pts BP at discharge is 136/92. Discharge instructions given to pt. Pt advised that she is to return in a week to receive second dose. Pt advised to schedule next appointment at front desk, pt verbalized understanding. Pt is alert, oriented, and ambulatory at discharge.

## 2022-10-21 ENCOUNTER — Inpatient Hospital Stay (HOSPITAL_COMMUNITY): Admission: RE | Admit: 2022-10-21 | Payer: 59 | Source: Ambulatory Visit

## 2022-11-13 ENCOUNTER — Ambulatory Visit: Payer: Self-pay | Admitting: Nurse Practitioner

## 2023-08-24 ENCOUNTER — Emergency Department (HOSPITAL_BASED_OUTPATIENT_CLINIC_OR_DEPARTMENT_OTHER)

## 2023-08-24 ENCOUNTER — Emergency Department (HOSPITAL_BASED_OUTPATIENT_CLINIC_OR_DEPARTMENT_OTHER)
Admission: EM | Admit: 2023-08-24 | Discharge: 2023-08-24 | Disposition: A | Discharge status: Home / Self Care | Attending: Emergency Medicine | Admitting: Emergency Medicine

## 2023-08-24 DIAGNOSIS — I10 Essential (primary) hypertension: Secondary | ICD-10-CM | POA: Diagnosis not present

## 2023-08-24 DIAGNOSIS — N83201 Unspecified ovarian cyst, right side: Secondary | ICD-10-CM | POA: Insufficient documentation

## 2023-08-24 DIAGNOSIS — R1031 Right lower quadrant pain: Secondary | ICD-10-CM | POA: Diagnosis present

## 2023-08-24 DIAGNOSIS — J45909 Unspecified asthma, uncomplicated: Secondary | ICD-10-CM | POA: Diagnosis not present

## 2023-08-24 DIAGNOSIS — Z7982 Long term (current) use of aspirin: Secondary | ICD-10-CM | POA: Insufficient documentation

## 2023-08-24 LAB — URINALYSIS, ROUTINE W REFLEX MICROSCOPIC
Bilirubin Urine: NEGATIVE
Glucose, UA: NEGATIVE mg/dL
Ketones, ur: NEGATIVE mg/dL
Leukocytes,Ua: NEGATIVE
Nitrite: NEGATIVE
Protein, ur: NEGATIVE mg/dL
Specific Gravity, Urine: 1.012 (ref 1.005–1.030)
pH: 6.5 (ref 5.0–8.0)

## 2023-08-24 LAB — COMPREHENSIVE METABOLIC PANEL
ALT: 11 U/L (ref 0–44)
AST: 13 U/L — ABNORMAL LOW (ref 15–41)
Albumin: 3.9 g/dL (ref 3.5–5.0)
Alkaline Phosphatase: 73 U/L (ref 38–126)
Anion gap: 6 (ref 5–15)
BUN: 7 mg/dL (ref 6–20)
CO2: 26 mmol/L (ref 22–32)
Calcium: 8.7 mg/dL — ABNORMAL LOW (ref 8.9–10.3)
Chloride: 105 mmol/L (ref 98–111)
Creatinine, Ser: 0.89 mg/dL (ref 0.44–1.00)
GFR, Estimated: >60 mL/min (ref >60)
Glucose, Bld: 87 mg/dL (ref 70–99)
Potassium: 4 mmol/L (ref 3.5–5.1)
Sodium: 137 mmol/L (ref 135–145)
Total Bilirubin: 0.4 mg/dL (ref 0.0–1.2)
Total Protein: 6.9 g/dL (ref 6.5–8.1)

## 2023-08-24 LAB — CBC WITH DIFFERENTIAL/PLATELET
Abs Immature Granulocytes: 0.02 10*3/uL (ref 0.00–0.07)
Basophils Absolute: 0 10*3/uL (ref 0.0–0.1)
Basophils Relative: 1 %
Eosinophils Absolute: 0.1 10*3/uL (ref 0.0–0.5)
Eosinophils Relative: 2 %
HCT: 42.1 % (ref 36.0–46.0)
Hemoglobin: 14.5 g/dL (ref 12.0–15.0)
Immature Granulocytes: 0 %
Lymphocytes Relative: 28 %
Lymphs Abs: 1.5 10*3/uL (ref 0.7–4.0)
MCH: 30.5 pg (ref 26.0–34.0)
MCHC: 34.4 g/dL (ref 30.0–36.0)
MCV: 88.6 fL (ref 80.0–100.0)
Monocytes Absolute: 0.3 10*3/uL (ref 0.1–1.0)
Monocytes Relative: 6 %
Neutro Abs: 3.4 10*3/uL (ref 1.7–7.7)
Neutrophils Relative %: 63 %
Platelets: 174 10*3/uL (ref 150–400)
RBC: 4.75 MIL/uL (ref 3.87–5.11)
RDW: 13 % (ref 11.5–15.5)
WBC: 5.3 10*3/uL (ref 4.0–10.5)
nRBC: 0 % (ref 0.0–0.2)

## 2023-08-24 LAB — PREGNANCY, URINE: Preg Test, Ur: NEGATIVE

## 2023-08-24 LAB — LIPASE, BLOOD: Lipase: 12 U/L (ref 11–51)

## 2023-08-24 MED ORDER — IOHEXOL 300 MG/ML  SOLN
100.0000 mL | Freq: Once | INTRAMUSCULAR | Status: AC | PRN
  Administered 2023-08-24: 100 mL via INTRAVENOUS

## 2023-08-24 MED ORDER — ONDANSETRON HCL 4 MG/2ML IJ SOLN
4.0000 mg | Freq: Once | INTRAMUSCULAR | Status: AC
  Administered 2023-08-24: 4 mg via INTRAVENOUS
  Filled 2023-08-24: qty 2

## 2023-08-24 MED ORDER — MORPHINE SULFATE (PF) 4 MG/ML IV SOLN
4.0000 mg | Freq: Once | INTRAVENOUS | Status: AC
  Administered 2023-08-24: 4 mg via INTRAVENOUS
  Filled 2023-08-24: qty 1

## 2023-08-24 NOTE — ED Notes (Signed)
Pt transport to ct 

## 2023-08-24 NOTE — ED Notes (Signed)
 Dc instructions reviewed with patient. Patient voiced understanding. Dc with belongings.

## 2023-08-24 NOTE — ED Notes (Signed)
Transport to ultrasound

## 2023-08-24 NOTE — ED Triage Notes (Signed)
 C/o RLQ x1 week. States pain was worse on Tuesday but was less after BM. Hx of fibroids.

## 2023-08-24 NOTE — Discharge Instructions (Signed)
 Your imaging today is reassuring.  Ultrasound does show 7.9cm right sided ovarian cyst. You should have follow up ultrasound in 6-12 weeks. You also have uterine fibroid of 4.2 cm. These findings to warrant OBGYN follow up appointment as discussed.   If your pain returns and becomes persistent and severe please return to emergency room.  If you start feeling very unwell associated nausea or vomiting come back for further evaluation.  Since her pain is well-controlled in emergency room I recommend taking Tylenol and ibuprofen for pain control.

## 2023-08-24 NOTE — ED Provider Notes (Signed)
 [place] [facility] AT [facility] Provider Note   CSN: [id number] Arrival date & time: 08/24/23  4098     History  Chief Complaint  Patient presents with   Abdominal Pain    Erin Zuniga is a 43 y.o. female past medical history of hypertension, asthma presenting to the emergency room with complaint of abdominal pain.  Patient reports abdominal pain has been ongoing for approximately 1 week and is specifically in right lower quadrant.  She describes pain as burning and constant however severity is intermittent.  Patient reports pain is typically around a 4 out of 10.  She reports decreased appetite and mild nausea.  Denies any vomiting.  She does report that she recently was in [place] and traveled back approximately 2 weeks ago.  She reports after returning back from [place] she had diarrhea for approximately 1 week.  She would have several loose stools per day on average 2-3.  She reports her bowel movements have now returned to normal and there own.  No recent antibiotic use.  She also endorses history of fibroids but she feels that this pain is different. Denies any chest pain, shortness of breath, diarrhea, constipation, fevers, generalized weakness, chills.   Abdominal Pain      Home Medications Prior to Admission medications   Medication Sig Start Date End Date Taking? Authorizing Provider  albuterol (PROVENTIL HFA;VENTOLIN HFA) 108 (90 BASE) MCG/ACT inhaler Inhale 2 puffs into the lungs every 6 (six) hours as needed for shortness of breath.    [provider]  aspirin 325 MG tablet Take 325 mg by mouth once as needed for pain.    [provider]  ferrous sulfate 325 (65 FE) MG tablet Take 325 mg by mouth daily with breakfast.    [provider]  medroxyPROGESTERone (PROVERA) 10 MG tablet Daily for day 1-14 each month 04/18/21   Adam Phenix, MD  Multiple Vitamin (MULTIVITAMIN WITH MINERALS) TABS tablet Take 1 tablet by mouth  daily.    [provider]  OVER THE COUNTER MEDICATION L-lysine PRN    [provider]      Allergies    Patient has no known allergies.    Review of Systems   Review of Systems  Gastrointestinal:  Positive for abdominal pain.    Physical Exam Updated Vital Signs BP (!) 170/99 (BP Location: Right Arm)   Pulse 73   Temp 98.7 F (37.1 C) (Oral)   Resp 18   Ht 5\' 10"  (1.778 m)   Wt (!) 156.9 kg   SpO2 100%   BMI 49.65 kg/m  Physical Exam Vitals and nursing note reviewed.  Constitutional:      General: She is not in acute distress.    Appearance: She is not toxic-appearing.  HENT:     Head: Normocephalic and atraumatic.  Eyes:     General: No scleral icterus.    Conjunctiva/sclera: Conjunctivae normal.  Cardiovascular:     Rate and Rhythm: Normal rate and regular rhythm.     Pulses: Normal pulses.     Heart sounds: Normal heart sounds.  Pulmonary:     Effort: Pulmonary effort is normal. No respiratory distress.     Breath sounds: Normal breath sounds.  Abdominal:     General: Abdomen is flat. Bowel sounds are normal. There is no distension.     Palpations: Abdomen is soft. There is no mass.     Tenderness: There is abdominal tenderness.  Comments: RLQ TTP, negative Rovsing.   Skin:    General: Skin is warm and dry.     Findings: No lesion.  Neurological:     General: No focal deficit present.     Mental Status: She is alert and oriented to person, place, and time. Mental status is at baseline.     ED Results / Procedures / Treatments   Labs (all labs ordered are listed, but only abnormal results are displayed) Labs Reviewed  COMPREHENSIVE METABOLIC PANEL - Abnormal; Notable for the following components:      Result Value   Calcium 8.7 (*)    AST 13 (*)    All other components within normal limits  URINALYSIS, ROUTINE W REFLEX MICROSCOPIC - Abnormal; Notable for the following components:   Hgb urine dipstick MODERATE (*)    Bacteria,  UA RARE (*)    All other components within normal limits  LIPASE, BLOOD  CBC WITH DIFFERENTIAL/PLATELET  PREGNANCY, URINE    EKG None  Radiology CT ABDOMEN PELVIS W CONTRAST Result Date: 08/24/2023 CLINICAL DATA:  Right lower quadrant abdominal pain for 1 week. EXAM: CT ABDOMEN AND PELVIS WITH CONTRAST TECHNIQUE: Multidetector CT imaging of the abdomen and pelvis was performed using the standard protocol following bolus administration of intravenous contrast. RADIATION DOSE REDUCTION: This exam was performed according to the departmental dose-optimization program which includes automated exposure control, adjustment of the mA and/or kV according to patient size and/or use of iterative reconstruction technique. CONTRAST:  OMNIPAQUE IOHEXOL 300 MG/ML  SOLN COMPARISON:  Pelvic ultrasound dated 05/25/2021 FINDINGS: Lower chest: No acute abnormality. Hepatobiliary: No focal liver abnormality is seen. No gallstones, gallbladder wall thickening, or biliary dilatation. Pancreas: Unremarkable. No pancreatic ductal dilatation or surrounding inflammatory changes. Spleen: Normal in size without focal abnormality. Adrenals/Urinary Tract: Adrenal glands are unremarkable. Kidneys are normal, without renal calculi, focal lesion, or hydronephrosis. Bladder is unremarkable. Stomach/Bowel: Stomach is within normal limits. Appendix appears normal. No evidence of bowel wall thickening, distention, or inflammatory changes. Vascular/Lymphatic: No significant vascular findings are present. No enlarged abdominal or pelvic lymph nodes. Reproductive: A uterine fibroid measures 4.9 cm in size, similar to prior exam. A right adnexal cyst measures 7.0 x 5.5 cm, similar to prior exam. It is unclear if the cyst wall is thickened or if ovarian tissue surrounds the cyst. The left ovary appears enlarged with multiple cystic spaces. Other: There is a small fat containing umbilical hernia. No significant free intraperitoneal fluid.  Musculoskeletal: No acute or significant osseous findings. IMPRESSION: 1. A right adnexal cyst measures 7.0 x 5.5 cm, similar to prior exam. It is unclear if the cyst wall is thickened or if ovarian tissue surrounds the cyst. The left ovary appears enlarged with multiple cystic spaces. These findings could be further evaluated with pelvic ultrasound with Doppler. Electronically Signed   By: Romona Curls M.D.   On: 08/24/2023 12:10    Procedures Procedures    Medications Ordered in ED Medications  morphine (PF) 4 MG/ML injection 4 mg (has no administration in time range)  ondansetron (ZOFRAN) injection 4 mg (has no administration in time range)    ED Course/ Medical Decision Making/ A&P                                 Medical Decision Making Amount and/or Complexity of Data Reviewed Labs: ordered. Radiology: ordered.  Risk Prescription drug management.   Erin Zuniga 43 y.o. presented today for abd pain. Working DDx includes, but not limited to, gastroenteritis, colitis, SBO, appendicitis, cholecystitis, hepatobiliary pathology, gastritis, PUD, ACS, dissection, pancreatitis, nephrolithiasis, AAA, UTI, pyelonephritis, torsion, fibroids.   R/o DDx: These are considered less likely than current impression due to history of present illness, physical exam, labs/imaging findings.  Review of prior external notes: none  Pmhx: hypertension, obesity   Unique Tests and My Interpretation:  CBC without leukocytosis and no anemia.  No significant electrolyte abnormality.  Lipase 12.  UA with moderate hemoglobin no nitrates or leukocytes.  Patient reports she is currently having vaginal spotting as she is on and off menstrual cycle.   Imaging:  CT Abd/Pelvis with contrast: evaluate for structural/surgical etiology of patients' severe abdominal pain.  CT imaging shows large right ovarian cyst.  They are recommending pelvic ultrasound for further evaluation given size of ovarian cyst feel  this would be beneficial.  Pelvic ultrasound showing no ovarian torsion.  Clinically she appears well given return precautions and educated on diagnosis.  Problem List / ED Course / Critical interventions / Medication management  Reporting to emergency room with 1 week of right lower quadrant pain.  She is well-appearing and hemodynamically stable.  She has not had significant change in oral intake.  She has had some diarrhea however have a low suspicion for bacterial enteritis as it has started to resolve on its own.  Patient reports roughly normal bowel movements for the past week following her travels.  Given that she has right lower quadrant tenderness on exam would like to rule out appendicitis or other acute abdominal pathologies.  Will check abdominal labs, urine and imaging.  Will closely reassess.  Labs are reassuring and CT imaging reassuring however does have large right adnexal cyst.  Will further evaluate with pelvic ultrasound.  Given duration of symptoms and presentation do have lower suspicion for ovarian torsion at this time.  Patient does continue to have some mild discomfort does feel like pelvic ultrasound would be beneficial to further evaluate. Ultrasound shows large right sided ovarian cyst.  Discussed all imaging results with patient and discussed follow-up.  Patient understands return precautions.  She is been hemodynamically stable throughout stay and pain has been well-controlled.  Have very low suspicion for ovarian torsion however will give return precautions if symptoms are to change. I ordered medication including morphine, zofran  Reevaluation of the patient after these medicines showed that the patient improved Patients vitals assessed. Upon arrival patient is hemodynamically stable.  I have reviewed the patients home medicines and have made adjustments as needed   Plan:  F/u w/ PCP in 2-3d to ensure resolution of sx.  Patient was given return precautions. Patient  stable for discharge at this time.  Patient educated on current sx/dx and verbalized understanding of plan. Return to ER w/ new or worsening sx.          Final Clinical Impression(s) / ED Diagnoses Final diagnoses:  Right ovarian cyst    Rx / DC Orders ED Discharge Orders     None         Smitty Knudsen, PA-C 08/24/23 1955    Virgina Norfolk, DO 08/27/23 1459

## 2023-08-25 ENCOUNTER — Telehealth: Payer: Self-pay

## 2023-08-25 NOTE — Transitions of Care (Post Inpatient/ED Visit) (Signed)
   08/25/2023  Name: Erin Zuniga MRN: [id number] DOB: March 20, 1981  Today's TOC FU Call Status: Today's TOC FU Call Status:: Unsuccessful Call (1st Attempt) Unsuccessful Call (1st Attempt) Date: 08/25/23  Attempted to reach the patient regarding the most recent Inpatient/ED visit.  Follow Up Plan: Additional outreach attempts will be made to reach the patient to complete the Transitions of Care (Post Inpatient/ED visit) call.   Signature  [employer], [place]

## 2023-08-26 ENCOUNTER — Telehealth: Payer: Self-pay

## 2023-08-26 NOTE — Transitions of Care (Post Inpatient/ED Visit) (Signed)
   08/26/2023  Name: Erin Zuniga MRN: [id number] DOB: 03/28/1981  Today's TOC FU Call Status:   Patient's Name and Date of Birth confirmed.  Transition Care Management Follow-up Telephone Call Date of Discharge: 08/24/23 Discharge Facility: [facility] ([facility]) Type of Discharge: Emergency Department Reason for ED Visit: Other: How have you been since you were released from the hospital?: Same Any questions or concerns?: No  Items Reviewed: Did you receive and understand the discharge instructions provided?: Yes Medications obtained,verified, and reconciled?: Yes (Medications Reviewed) Any new allergies since your discharge?: No Dietary orders reviewed?: No Do you have support at home?: No  Medications Reviewed Today: Medications Reviewed Today     Reviewed by Veneta Penton, CMA (Certified Medical Assistant) on 08/26/23 at [phone]  Med List Status: <None>   Medication Order Taking? Sig Documenting Provider Last Dose Status Informant  albuterol (PROVENTIL HFA;VENTOLIN HFA) 108 (90 BASE) MCG/ACT inhaler [id number] No Inhale 2 puffs into the lungs every 6 (six) hours as needed for shortness of breath.  Patient not taking: Reported on 08/26/2023   [provider] Not Taking Active Self  aspirin 325 MG tablet [id number] No Take 325 mg by mouth once as needed for pain.  Patient not taking: Reported on 08/26/2023   [provider] Not Taking Active Self  ferrous sulfate 325 (65 FE) MG tablet [id number] Yes Take 325 mg by mouth daily with breakfast. [provider] Taking Active   medroxyPROGESTERone (PROVERA) 10 MG tablet [id number] No Daily for day 1-14 each month  Patient not taking: Reported on 08/26/2023   Adam Phenix, MD Not Taking Active   Multiple Vitamin (MULTIVITAMIN WITH MINERALS) TABS tablet [id number] Yes Take 1 tablet by mouth daily. [provider] Taking Active Self  OVER THE COUNTER MEDICATION [id number] No L-lysine PRN  Patient not taking:  Reported on 08/26/2023   [provider] Not Taking Active   Med List Note Silvestre Gunner, CPhT 04/15/13 1253): No preferred pharmacy.             Home Care and Equipment/Supplies: Were Home Health Services Ordered?: No Any new equipment or medical supplies ordered?: No  Functional Questionnaire: Do you need assistance with bathing/showering or dressing?: No Do you need assistance with meal preparation?: No Do you need assistance with eating?: No Do you have difficulty maintaining continence: No Do you need assistance with getting out of bed/getting out of a chair/moving?: No Do you have difficulty managing or taking your medications?: No  Follow up appointments reviewed: PCP Follow-up appointment confirmed?: Yes Date of PCP follow-up appointment?: 09/04/23 Follow-up Provider: Angus Seller Specialist [facility] Follow-up appointment confirmed?: Yes Do you need transportation to your follow-up appointment?: No Do you understand care options if your condition(s) worsen?: Yes-patient verbalized understanding    SIGNATURE Karey Suthers, CMA

## 2023-09-05 ENCOUNTER — Inpatient Hospital Stay: Payer: Self-pay | Admitting: Nurse Practitioner

## 2023-12-10 ENCOUNTER — Encounter: Admitting: Obstetrics and Gynecology

## 2023-12-30 ENCOUNTER — Encounter: Admitting: Obstetrics and Gynecology

## 2024-01-12 ENCOUNTER — Encounter: Admitting: Obstetrics and Gynecology
